# Patient Record
Sex: Female | Born: 1990
Health system: Western US, Academic
[De-identification: ages and names within clinical notes are randomized; demographics above are authoritative.]

---
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
## 2014-11-24 ENCOUNTER — Ambulatory Visit: Payer: Self-pay | Admitting: Urology

## 2017-07-05 ENCOUNTER — Encounter (HOSPITAL_BASED_OUTPATIENT_CLINIC_OR_DEPARTMENT_OTHER): Payer: Self-pay | Admitting: *Deleted

## 2017-07-05 ENCOUNTER — Emergency Department (HOSPITAL_BASED_OUTPATIENT_CLINIC_OR_DEPARTMENT_OTHER)
Admission: EM | Admit: 2017-07-05 | Discharge: 2017-07-05 | Disposition: A | Discharge status: Home / Self Care | Payer: 59 | Attending: Emergency Medicine | Admitting: Emergency Medicine

## 2017-07-05 ENCOUNTER — Other Ambulatory Visit: Payer: Self-pay

## 2017-07-05 ENCOUNTER — Emergency Department (HOSPITAL_BASED_OUTPATIENT_CLINIC_OR_DEPARTMENT_OTHER): Payer: 59

## 2017-07-05 DIAGNOSIS — W540XXA Bitten by dog, initial encounter: Secondary | ICD-10-CM | POA: Insufficient documentation

## 2017-07-05 DIAGNOSIS — Y999 Unspecified external cause status: Secondary | ICD-10-CM | POA: Insufficient documentation

## 2017-07-05 DIAGNOSIS — S61411A Laceration without foreign body of right hand, initial encounter: Secondary | ICD-10-CM | POA: Insufficient documentation

## 2017-07-05 DIAGNOSIS — Y939 Activity, unspecified: Secondary | ICD-10-CM | POA: Diagnosis not present

## 2017-07-05 DIAGNOSIS — S61432A Puncture wound without foreign body of left hand, initial encounter: Secondary | ICD-10-CM | POA: Insufficient documentation

## 2017-07-05 DIAGNOSIS — Y929 Unspecified place or not applicable: Secondary | ICD-10-CM | POA: Diagnosis not present

## 2017-07-05 MED ORDER — TETANUS-DIPHTH-ACELL PERTUSSIS 5-2.5-18.5 LF-MCG/0.5 IM SUSP
0.5000 mL | Freq: Once | INTRAMUSCULAR | Status: AC
  Administered 2017-07-05: 0.5 mL via INTRAMUSCULAR
  Filled 2017-07-05: qty 0.5

## 2017-07-05 MED ORDER — AMOXICILLIN-POT CLAVULANATE 875-125 MG PO TABS: 1.0000 | ORAL_TABLET | Freq: Two times a day (BID) | ORAL | 0 refills | Status: AC

## 2017-07-05 MED ORDER — LIDOCAINE HCL 2 % IJ SOLN
20.0000 mL | Freq: Once | INTRAMUSCULAR | Status: AC
  Administered 2017-07-05: 400 mg
  Filled 2017-07-05: qty 20

## 2017-07-05 MED ORDER — OXYCODONE-ACETAMINOPHEN 5-325 MG PO TABS
1.0000 | ORAL_TABLET | Freq: Once | ORAL | Status: AC
  Administered 2017-07-05: 1 via ORAL
  Filled 2017-07-05: qty 1

## 2017-07-05 MED FILL — AMOX-CLAV 875-125 MG TABLET: 875-125 | 5 days supply | Qty: 10 | Fill #0

## 2017-07-05 NOTE — ED Provider Notes (Signed)
[facility] EMERGENCY DEPARTMENT Provider Note   CSN: [id number] Arrival date & time: 07/05/17  1456     History   Chief Complaint Chief Complaint  Patient presents with  . Animal Bite    HPI Tanya Long is a 27 y.o. female.  The history is provided by the patient and medical records. No language interpreter was used.  Animal Bite  Associated symptoms: no fever and no numbness    Tanya Long is an otherwise healthy 27 y.o. female who presents to the Emergency Department for evaluation of dog bite. Patient states that about 45 minutes ago, she was at home with her own two dogs whom are fully vaccinated when they began fighting. She tried to break them up and sustained multiple lacerations to bilateral hands. Endorses pain to these sites. No numbness, tingling or weakness. She ran hands under sink water. No medications taken prior to arrival. Unsure of last tetanus vaccine.   History reviewed. No pertinent past medical history.  There are no active problems to display for this patient.   History reviewed. No pertinent surgical history.  OB History    No data available       Home Medications    Prior to Admission medications   Medication Sig Start Date End Date Taking? Authorizing Provider  amoxicillin-clavulanate (AUGMENTIN) 875-125 MG tablet Take 1 tablet by mouth every 12 (twelve) hours. 07/05/17   Kaelee Pfeffer, Chase PicketJaime Pilcher, PA-C    Family History No family history on file.  Social History Social History   Tobacco Use  . Smoking status: Never Smoker  . Smokeless tobacco: Never Used  Substance Use Topics  . Alcohol use: Yes  . Drug use: No     Allergies   Patient has no known allergies.   Review of Systems Review of Systems  Constitutional: Negative for chills and fever.  Musculoskeletal: Positive for myalgias.  Skin: Positive for wound.  Neurological: Negative for weakness and numbness.     Physical Exam Updated Vital Signs BP  127/83 (BP Location: Left Arm)   Pulse 87   Resp 18   Ht 5\' 9"  (1.753 m)   Wt 73.5 kg (162 lb)   LMP 06/14/2017   SpO2 100%   BMI 23.92 kg/m   Physical Exam  Constitutional: She appears well-developed and well-nourished. No distress.  HENT:  Head: Normocephalic and atraumatic.  Neck: Neck supple.  Cardiovascular: Normal rate, regular rhythm and normal heart sounds.  No murmur heard. Pulmonary/Chest: Effort normal and breath sounds normal. No respiratory distress. She has no wheezes. She has no rales.  Musculoskeletal: Normal range of motion.  Bilateral hands with full ROM and 5/5 grip strength. Good cap refill in all digits. Sensation intact to radial, ulnar and median nerve distributions.  Neurological: She is alert.  Skin: Skin is warm and dry.  Right hand with 3 cm laceration to dorsum and 1 cm puncture wound to web space. Left hand with multiple small abrasions.  Nursing note and vitals reviewed.    ED Treatments / Results  Labs (all labs ordered are listed, but only abnormal results are displayed) Labs Reviewed - No data to display  EKG  EKG Interpretation None       Radiology Dg Hand Complete Left  Result Date: 07/05/2017 CLINICAL DATA:  Dog bite with lacerations. EXAM: LEFT HAND - COMPLETE 3+ VIEW COMPARISON:  None. FINDINGS: There is no evidence of fracture or dislocation. There is no evidence of arthropathy or other focal  bone abnormality. Soft tissues are unremarkable. IMPRESSION: No evidence of fracture, radiopaque foreign object or air/gas in any joint. Electronically Signed   By: Paulina Fusi M.D.   On: 07/05/2017 15:44   Dg Hand Complete Right  Result Date: 07/05/2017 CLINICAL DATA:  Dog bite injury with lacerations EXAM: RIGHT HAND - COMPLETE 3+ VIEW COMPARISON:  None. FINDINGS: There is no evidence of fracture or dislocation. There is no evidence of arthropathy or other focal bone abnormality. Soft tissues are unremarkable. IMPRESSION: Evidence of fracture,  radiopaque foreign object or air/gas in any joint. Electronically Signed   By: Paulina Fusi M.D.   On: 07/05/2017 15:43    Procedures .Marland KitchenLaceration Repair Date/Time: 07/05/2017 6:06 PM Performed by: Awesome Jared, Chase Picket, PA-C Authorized by: Ashli Selders, Chase Picket, PA-C   Consent:    Consent obtained:  Verbal   Consent given by:  Patient   Risks discussed:  Pain, infection, poor cosmetic result and poor wound healing   Alternatives discussed:  No treatment and delayed treatment Anesthesia (see MAR for exact dosages):    Anesthesia method:  Local infiltration   Local anesthetic:  Lidocaine 2% w/o epi (5ml) Laceration details:    Location:  Hand   Hand location:  R hand, dorsum   Length (cm):  3 Repair type:    Repair type:  Simple Pre-procedure details:    Preparation:  Patient was prepped and draped in usual sterile fashion Exploration:    Hemostasis achieved with:  Direct pressure   Wound exploration: wound explored through full range of motion     Wound extent: no foreign bodies/material noted, no muscle damage noted, no nerve damage noted, no tendon damage noted and no underlying fracture noted   Treatment:    Area cleansed with:  Saline and Betadine   Amount of cleaning:  Extensive   Irrigation solution:  Sterile saline Skin repair:    Repair method:  Sutures   Suture size:  5-0   Suture material:  Prolene   Suture technique:  Simple interrupted   Number of sutures:  1 Approximation:    Approximation:  Loose Post-procedure details:    Patient tolerance of procedure:  Tolerated well, no immediate complications   (including critical care time)  Medications Ordered in ED Medications  Tdap (BOOSTRIX) injection 0.5 mL (0.5 mLs Intramuscular Given 07/05/17 1631)  oxyCODONE-acetaminophen (PERCOCET/ROXICET) 5-325 MG per tablet 1 tablet (1 tablet Oral Given 07/05/17 1544)  lidocaine (XYLOCAINE) 2 % (with pres) injection 400 mg (400 mg Infiltration Given 07/05/17 1544)     Initial  Impression / Assessment and Plan / ED Course  I have reviewed the triage vital signs and the nursing notes.  Pertinent labs & imaging results that were available during my care of the patient were reviewed by me and considered in my medical decision making (see chart for details).    Tanya Long is a 27 y.o. female who presents to ED for dog bite to bilateral hands just prior to arrival. Tetanus updated in ED today. Dogs were her own fully vaccinated. No need for rabies vaccine. X-rays reviewed with no acute abnormalities. NVI on exam. Wounds extensively cleaned in ED today. One loose suture placed to gaping wound at the dorsum of the hand. Home care instructions, follow up care and return precautions discussed. All questions answered.   Patient seen by and discussed with Dr. Rush Landmark who agrees with treatment plan.   Final Clinical Impressions(s) / ED Diagnoses   Final diagnoses:  Dog  bite, initial encounter  Laceration of right hand without foreign body, initial encounter    ED Discharge Orders        Ordered    amoxicillin-clavulanate (AUGMENTIN) 875-125 MG tablet  Every 12 hours     07/05/17 1635       Ansel Ferrall, Chase Picket, PA-C 07/05/17 1901    Tegeler, Canary Brim, MD 07/06/17 [phone]

## 2017-07-05 NOTE — ED Triage Notes (Signed)
She was breaking up a fight between 2 dogs and was bitten numerus times to both hands. Puncture bites noted.

## 2017-07-05 NOTE — Discharge Instructions (Signed)
It was my pleasure taking care of you today!  Please take all of your antibiotics until finished!  Keep wounds clean and dry.   Return to ER for redness around the dog bites, fever, new or worsening symptoms, any additional concerns.

## 2017-07-05 NOTE — ED Notes (Signed)
At present time [facility] at [facility].  Pt. Is having wound irrigation done after she  Has been numbed and has had pain meds.

## 2018-07-29 IMAGING — CR DG HAND COMPLETE 3+V*R*
3 series · 3 of 3 positions shown · non-contrast
Comparison: None.

CLINICAL DATA: Dog bite injury with lacerations

EXAM:
RIGHT HAND - COMPLETE 3+ VIEW

[x hand pa right]
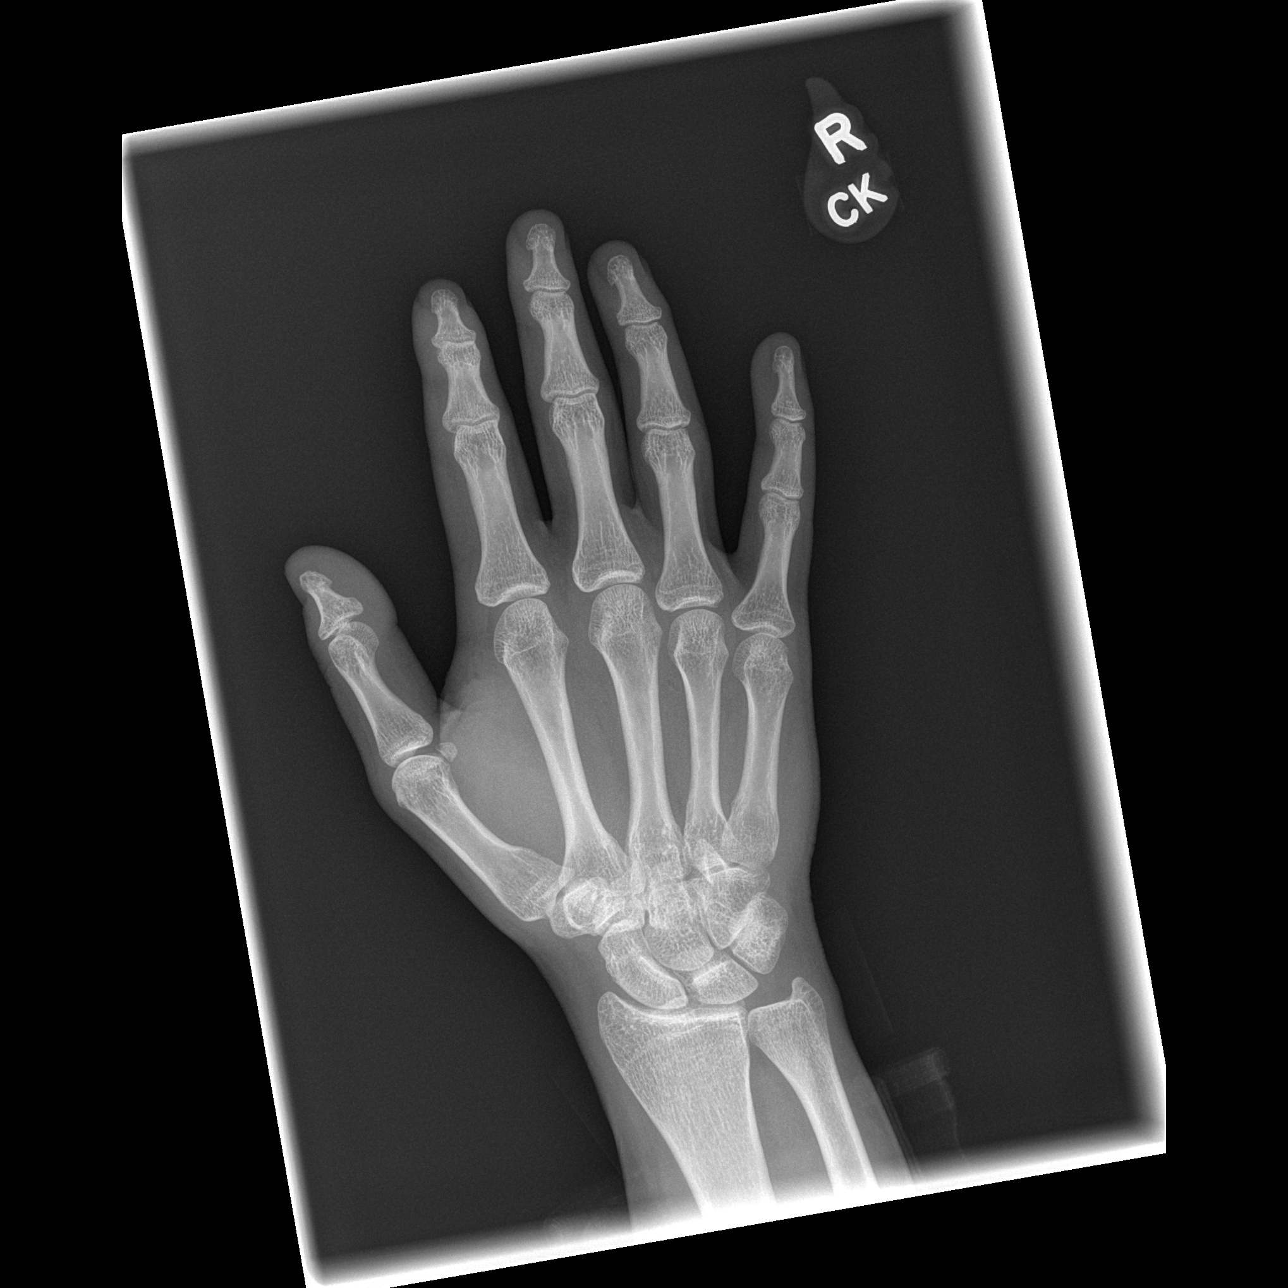

[x hand oblique right]
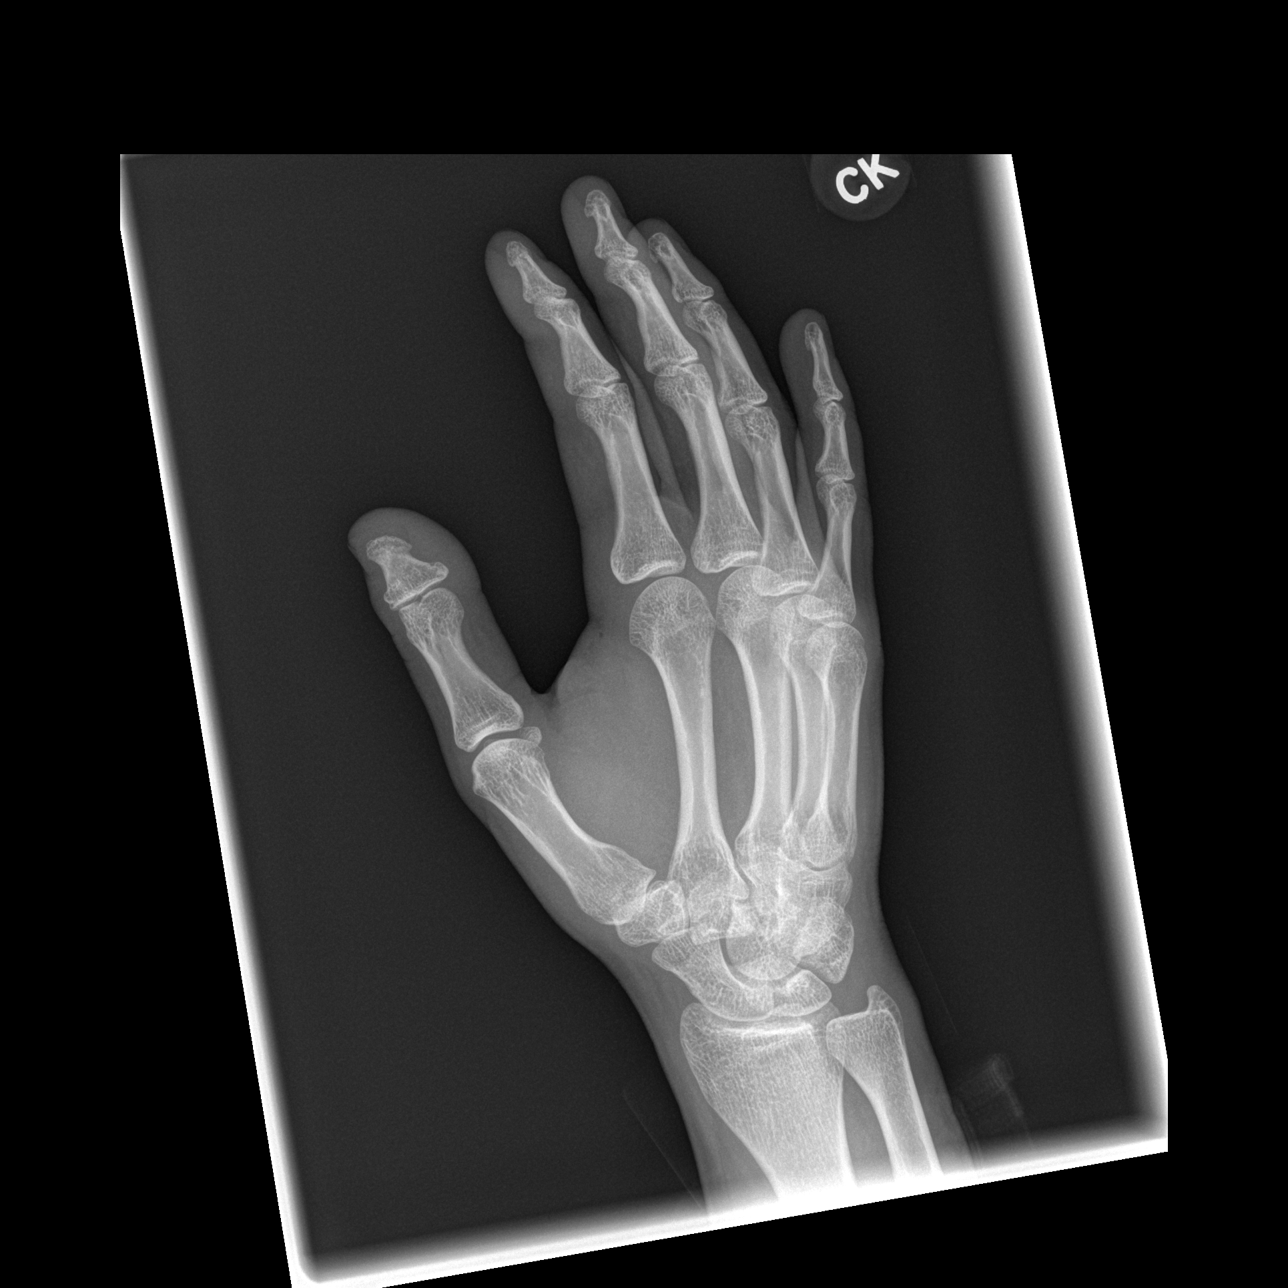

[x hand lat right]
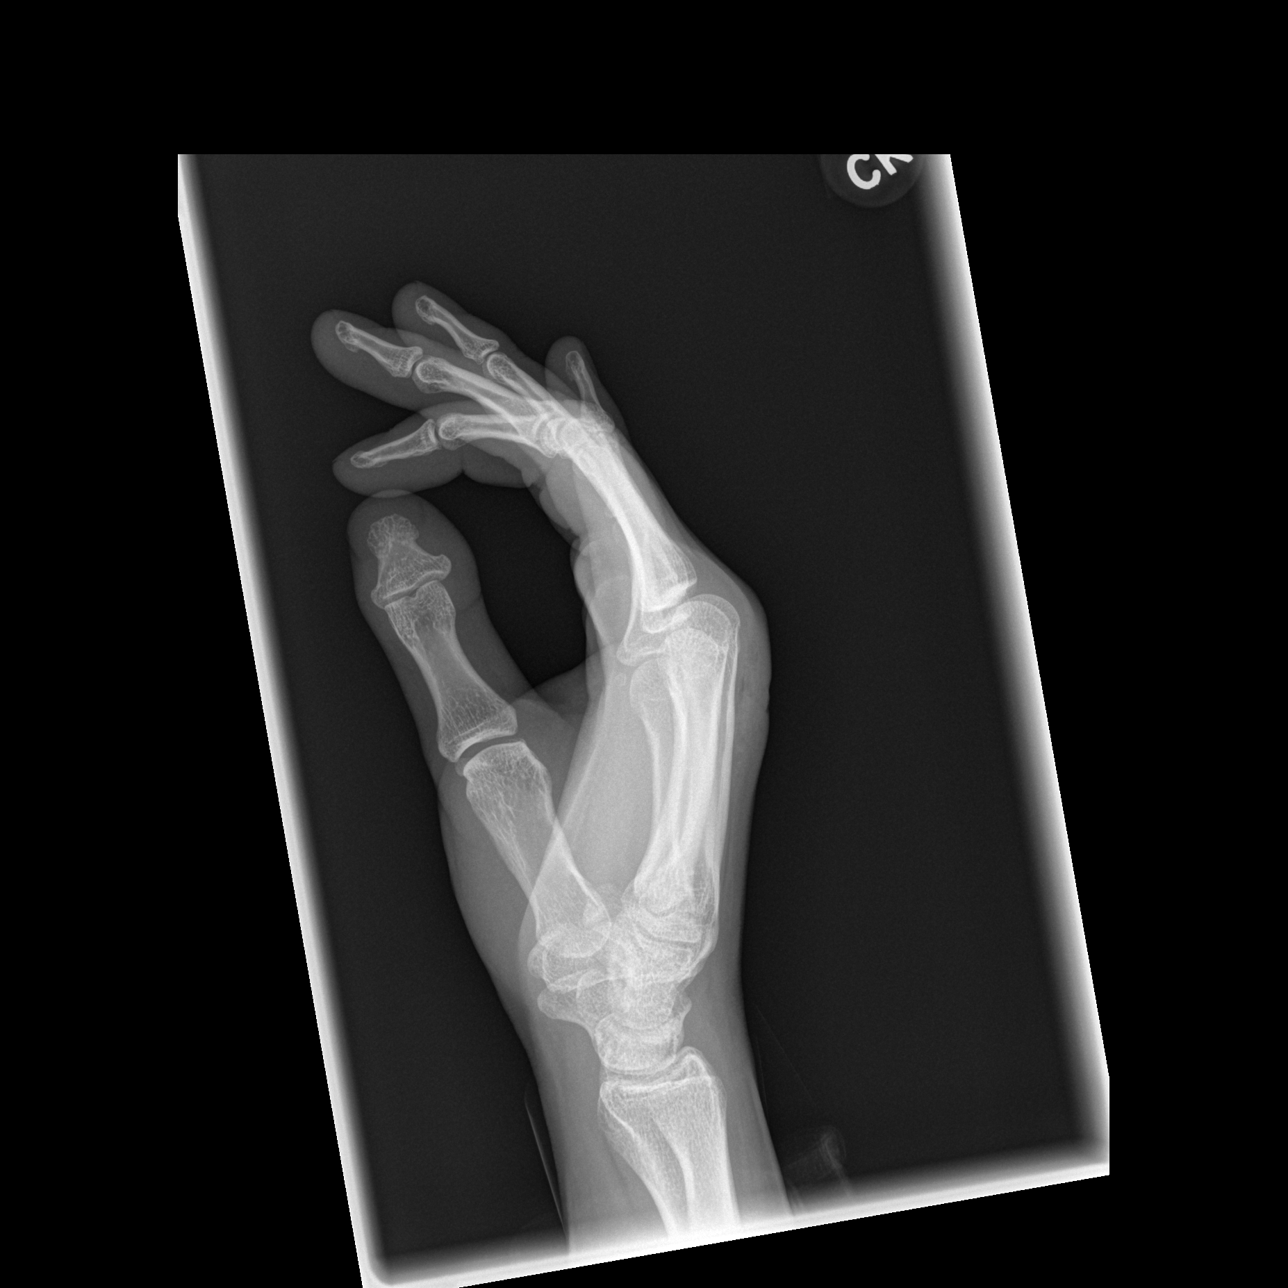

[3 of 3 positions shown; findings below may reference images not displayed]

FINDINGS: There is no evidence of fracture or dislocation. There is no
evidence of arthropathy or other focal bone abnormality. Soft
tissues are unremarkable.
IMPRESSION: Evidence of fracture, radiopaque foreign object or air/gas in any
joint.

## 2021-02-08 ENCOUNTER — Ambulatory Visit: Payer: BLUE CROSS/BLUE SHIELD

## 2021-02-08 DIAGNOSIS — Z0000 Encounter for general adult medical examination without abnormal findings: Secondary | ICD-10-CM

## 2021-02-08 DIAGNOSIS — Z23 Encounter for immunization: Secondary | ICD-10-CM

## 2021-02-08 MED ORDER — NORETHIN ACE-ETH ESTRAD-FE 1-20 MG-MCG(24) PO TABS
1 | Freq: Every day | ORAL | 3 refills | Status: AC
Start: 2021-02-08 — End: ?

## 2021-02-08 NOTE — Progress Notes
PATIENT: Cheryl Nguyen  MRN: [id number]  DOB: 07-19-1990  DATE OF SERVICE: 02/08/2021    REFERRING PRACTITIONER: No ref. provider found  PRIMARY CARE PROVIDER: Chrissie Noa, MD      CHIEF COMPLAINT:   Chief Complaint   Patient presents with   ? Annual Exam   ? Medication Refill     Oral contraceptive         Subjective:      Cheryl Nguyen is a 30 y.o. female here for preventive exam     Pap  - last year Dr Christen Butter ([facility])  OCP - no ADR. occassional spotting. 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       No past medical history on file.,   No past surgical history on file., No family history on file.,   Social History     Socioeconomic History   ? Marital status: Unknown      and No Known Allergies       Review of Systems:   ROS     Objective:        Vitals:  BP 112/68  ~ Pulse 70  ~ Temp 36.2 ?C (97.1 ?F)  ~ Ht 5' 9'' (1.753 m)  ~ Wt 170 lb (77.1 kg)  ~ LMP 08/29/2020 (Approximate)  ~ SpO2 100%  ~ BMI 25.10 kg/m?      Physical Exam  Vitals and nursing note reviewed.   Constitutional:       General: She is not in acute distress.     Appearance: Normal appearance. She is not ill-appearing, toxic-appearing or diaphoretic.   HENT:      Head: Normocephalic and atraumatic.      Right Ear: Tympanic membrane normal.      Left Ear: Tympanic membrane normal.   Eyes:      General: No scleral icterus.     Conjunctiva/sclera: Conjunctivae normal.      Pupils: Pupils are equal, round, and reactive to light.   Neck:      Thyroid: No thyromegaly or thyroid tenderness.      Vascular: No carotid bruit.   Cardiovascular:      Rate and Rhythm: Normal rate and regular rhythm.      Heart sounds: No murmur heard.  Pulmonary:      Effort: Pulmonary effort is normal.      Breath sounds: Normal breath sounds.   Abdominal:      Palpations: Abdomen is soft.      Tenderness: There is no abdominal tenderness.   Musculoskeletal:         General: Normal range of motion. Cervical back: Normal range of motion.      Right lower leg: No edema.      Left lower leg: No edema.   Skin:     General: Skin is warm and dry.      Capillary Refill: Capillary refill takes less than 2 seconds.   Neurological:      General: No focal deficit present.      Mental Status: She is alert and oriented to person, place, and time.      Cranial Nerves: Cranial nerves are intact.      Motor: Motor function is intact.      Coordination: Coordination is intact.   Psychiatric:         Attention and Perception: Attention normal.         Mood and Affect:  Mood normal.         Speech: Speech normal.         Behavior: Behavior normal.         Thought Content: Thought content normal.         Cognition and Memory: Cognition normal.         Judgment: Judgment normal.         Lab Review:          Assessment & Plan:        Diagnoses and all orders for this visit:    Routine general medical examination at a health care facility   -pap requested   - declines sTI testing   - BP at goal, BMI at goal  Need for influenza vaccination  -     Influenza vaccine IM;   PF    Other orders  -     norethindrone-ethinyl estradiol-iron (LOESTRIN 4 FE) 1-20 mg-mcg (24) tablet; Take 1 tablet by mouth daily.        F/u 1 year    Author:  Chrissie Noa 02/08/2021 2:03 PM

## 2021-02-21 NOTE — Progress Notes
Request for records faxed to Dr. Christen Butter MD at [phone]

## 2021-12-31 MED ORDER — HAILEY 24 FE 1-20 MG-MCG(24) PO TABS
1.0 | Freq: Every day | ORAL | 3 refills
Start: 2021-12-31 — End: ?

## 2022-01-02 MED ORDER — HAILEY 24 FE 1-20 MG-MCG(24) PO TABS
1.0 | Freq: Every day | ORAL | 0 refills | Status: AC
Start: 2022-01-02 — End: ?

## 2022-03-29 MED ORDER — HAILEY 24 FE 1-20 MG-MCG(24) PO TABS
1.0 | Freq: Every day | ORAL | 2.00 refills | 84.00000 days
Start: 2022-03-29 — End: ?

## 2022-04-28 ENCOUNTER — Ambulatory Visit: Payer: BLUE CROSS/BLUE SHIELD

## 2022-04-28 DIAGNOSIS — Z124 Encounter for screening for malignant neoplasm of cervix: Secondary | ICD-10-CM

## 2022-04-28 DIAGNOSIS — Z0000 Encounter for general adult medical examination without abnormal findings: Secondary | ICD-10-CM

## 2022-04-28 MED ORDER — HAILEY 24 FE 1-20 MG-MCG(24) PO TABS
1.0 | Freq: Every day | ORAL | 3 refills | Status: AC
Start: 2022-04-28 — End: ?

## 2022-04-28 NOTE — Patient Instructions
For labs, you can walk into [facility] on the 1st floor of our building (no appointment needed) -                  [facility]              [address]              [place], [place] [address]               Hours: Monday - Friday, 8 am - 5 pm     You can text [phone] with ''burltorrance'' ahead to get in line virtually to minimize waiting in line in person.  You will then receive text message to confirm your place in line (with estimate on wait time).  You also can download the ''QLess'' app, and it will go through similar process to get you in line virtually.  Please note that lines may be longer in the mornings.        I will send you copies of your results either online (if you are signed up with [facility]) or in the mail within 1-2 wks. Please reach out if you haven't heard from me after this period.       Dr Smiley Houseman [facility])  [facility])

## 2022-04-28 NOTE — Progress Notes
PATIENT: Cheryl Nguyen  MRN: [id number]  DOB: 1991-02-19  DATE OF SERVICE: 04/28/2022    REFERRING PRACTITIONER: No ref. provider found  PRIMARY CARE PROVIDER: Chrissie Noa, MD      CHIEF COMPLAINT:   No chief complaint on file.       Subjective:      Cheryl Nguyen is a 31 y.o. female here for preventive exam     Pap  - last year Dr Christen Butter ([facility])  OCP - no ADR. occassional spotting. 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       No past medical history on file.,   No past surgical history on file.,   Family History   Problem Relation Age of Onset    Hypothyroidism Mother     No Known Problems Father     Depression Brother     ADD / ADHD Brother     Depression Brother     ADD / ADHD Brother     Stroke Paternal Grandmother     Heart attack Paternal Grandfather     Breast cancer Paternal Aunt 14   ,   Social History     Socioeconomic History    Marital status: Significant Other   Occupational History    Occupation: Barista   Tobacco Use    Smoking status: Never    Smokeless tobacco: Never   Vaping Use    Vaping Use: Never used   Substance and Sexual Activity    Alcohol use: Yes     Alcohol/week: 1.2 oz     Types: 2 Standard drinks or equivalent per week    Drug use: Never    Sexual activity: Yes     Partners: Female      and No Known Allergies       Review of Systems:   ROS     Objective:        Vitals:  There were no vitals taken for this visit.     Physical Exam  Vitals and nursing note reviewed.   Constitutional:       General: She is not in acute distress.     Appearance: Normal appearance. She is not ill-appearing, toxic-appearing or diaphoretic.   HENT:      Head: Normocephalic and atraumatic.      Right Ear: Tympanic membrane normal.      Left Ear: Tympanic membrane normal.   Eyes:      General: No scleral icterus.     Conjunctiva/sclera: Conjunctivae normal.      Pupils: Pupils are equal, round, and reactive to light.   Neck: Thyroid: No thyromegaly or thyroid tenderness.      Vascular: No carotid bruit.   Cardiovascular:      Rate and Rhythm: Normal rate and regular rhythm.      Heart sounds: No murmur heard.  Pulmonary:      Effort: Pulmonary effort is normal.      Breath sounds: Normal breath sounds.   Abdominal:      Palpations: Abdomen is soft.      Tenderness: There is no abdominal tenderness.   Musculoskeletal:         General: Normal range of motion.      Cervical back: Normal range of motion.      Right lower leg: No edema.      Left lower leg: No edema.   Skin:  General: Skin is warm and dry.      Capillary Refill: Capillary refill takes less than 2 seconds.   Neurological:      General: No focal deficit present.      Mental Status: She is alert and oriented to person, place, and time.      Motor: Motor function is intact.      Coordination: Coordination is intact.   Psychiatric:         Attention and Perception: Attention normal.         Mood and Affect: Mood normal.         Speech: Speech normal.         Behavior: Behavior normal.         Thought Content: Thought content normal.         Cognition and Memory: Cognition normal.         Judgment: Judgment normal.         Lab Review:          Assessment & Plan:        Diagnoses and all orders for this visit:    Routine general medical examination at a health care facility   -pap requested   - declines sTI testing   - BP at goal, BMI at goal  Need for influenza vaccination  -     Influenza vaccine IM;   PF    Other orders  -     norethindrone-ethinyl estradiol-iron (LOESTRIN 4 FE) 1-20 mg-mcg (24) tablet; Take 1 tablet by mouth daily.        F/u 1 year    Author:  Chrissie Noa 04/28/2022 12:49 PM

## 2022-05-05 LAB — HPV DNA PCR: HPV TYPE 18: NEGATIVE

## 2022-05-10 LAB — Liquid-based pap smear: LAB AP GYN INTERPRETATION: NEGATIVE

## 2023-03-12 MED ORDER — HAILEY 24 FE 1-20 MG-MCG(24) PO TABS
1 | Freq: Every day | ORAL | 3 refills
Start: 2023-03-12 — End: ?

## 2023-03-15 MED ORDER — HAILEY 24 FE 1-20 MG-MCG(24) PO TABS
1 | Freq: Every day | ORAL | 3 refills
Start: 2023-03-15 — End: ?
